# Patient Record
Sex: Female | Born: 1960 | Race: Asian | Hispanic: No | Marital: Married | Smoking: Never smoker
Health system: Southern US, Community
[De-identification: ages and names within clinical notes are randomized; demographics above are authoritative.]

## PROBLEM LIST (undated history)

## (undated) DIAGNOSIS — E7800 Pure hypercholesterolemia, unspecified: Secondary | ICD-10-CM

## (undated) DIAGNOSIS — E039 Hypothyroidism, unspecified: Secondary | ICD-10-CM

## (undated) DIAGNOSIS — M1990 Unspecified osteoarthritis, unspecified site: Secondary | ICD-10-CM

## (undated) HISTORY — PX: THYROID SURGERY: SHX805

## (undated) HISTORY — PX: ABDOMINAL HYSTERECTOMY: SHX81

## (undated) HISTORY — PX: ROTATOR CUFF REPAIR: SHX139

---

## 2003-06-02 ENCOUNTER — Other Ambulatory Visit: Admission: RE | Admit: 2003-06-02 | Discharge: 2003-06-02 | Payer: Self-pay | Admitting: Obstetrics and Gynecology

## 2003-06-18 ENCOUNTER — Ambulatory Visit (HOSPITAL_COMMUNITY): Admission: RE | Admit: 2003-06-18 | Discharge: 2003-06-18 | Payer: Self-pay | Admitting: Obstetrics and Gynecology

## 2003-08-06 ENCOUNTER — Encounter (INDEPENDENT_AMBULATORY_CARE_PROVIDER_SITE_OTHER): Payer: Self-pay | Admitting: *Deleted

## 2003-08-06 ENCOUNTER — Ambulatory Visit (HOSPITAL_COMMUNITY): Admission: RE | Admit: 2003-08-06 | Discharge: 2003-08-06 | Payer: Self-pay | Admitting: Surgery

## 2003-12-19 IMAGING — US US BIOPSY
1 series · 8 of 8 positions shown · non-contrast
Comparison: none

CLINICAL DATA: Patient with a history of thyroid ultrasound at [HOSPITAL] on 06/18/03 which revealed 1.9 x 1.2 x 1.2 cm solitary mass in the inferior aspect of the right thyroid lobe.  Request is made for fine needle aspiration of this solitary right thyroid nodule. 
ULTRASOUND GUIDED FINE NEEDLE ASPIRATION OF SOLITARY RIGHT THYROID LOBE NODULE   - 08/06/03
FINDINGS
An ultrasound guided thyroid biopsy was thoroughly discussed with the patient and questions were answered.  Risks and benefits of the procedure were also delineated.  Most specifically discussion included bleeding, bruising, infection, risks of injury to adjacent blood vessels and nerves. The patient understands and wishes to proceed.  Verbal and written consent was obtained.  
After the patient was prepped and draped in the normal sterile fashion, 1 percent lidocaine was used for local anesthesia.  Under direct ultrasound guidance, four passes were then made using 25 gauge hypodermic needles into the solitary right thyroid lobe nodule. Ultrasound imaging confirmed appropriate needle placement in the nodule. Specimens were given to cytology for further analysis. The patient tolerated the procedure well and there were no immediate complications.  No hematoma was identified post procedure.  The procedure was performed under the direct supervision of Dr. Portaru Ratta.  
IMPRESSION
Successful ultrasound guided fine needle aspiration of a solitary right thyroid lobe nodule as discussed above.

[Series 1: unknown · 0.07mm/px · 8 of 8 slices shown]
[im 1/8]
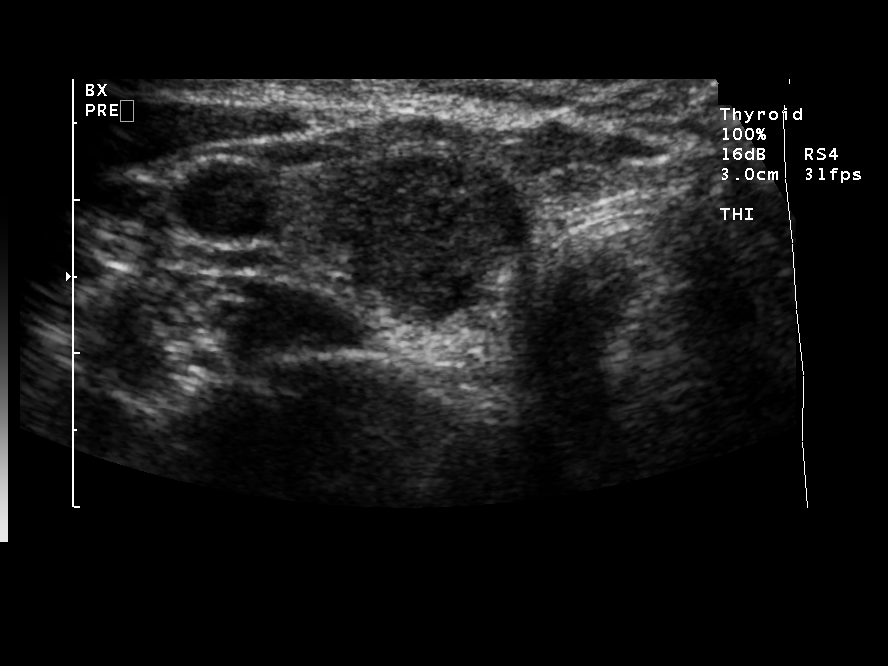
[im 2/8]
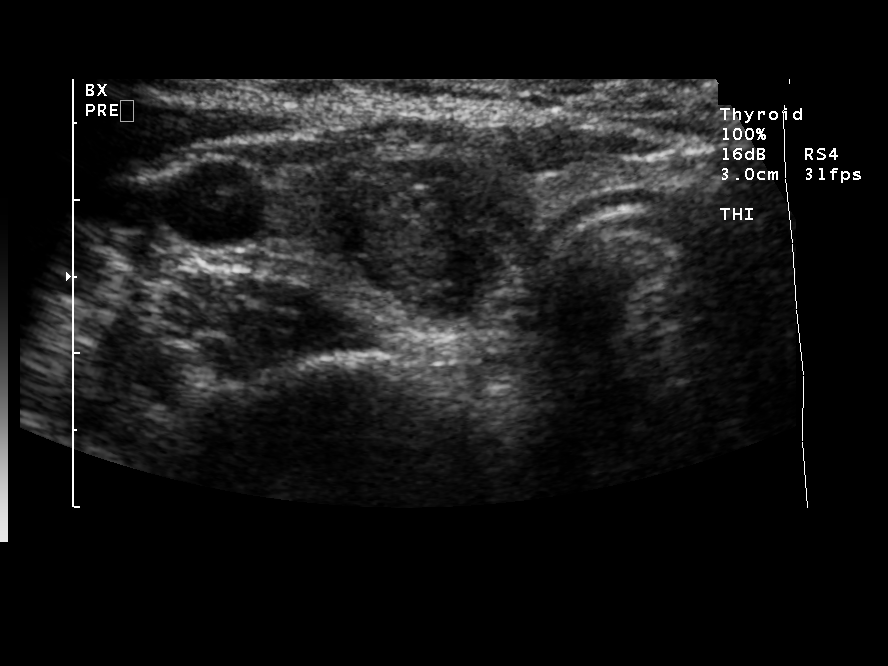
[im 3/8]
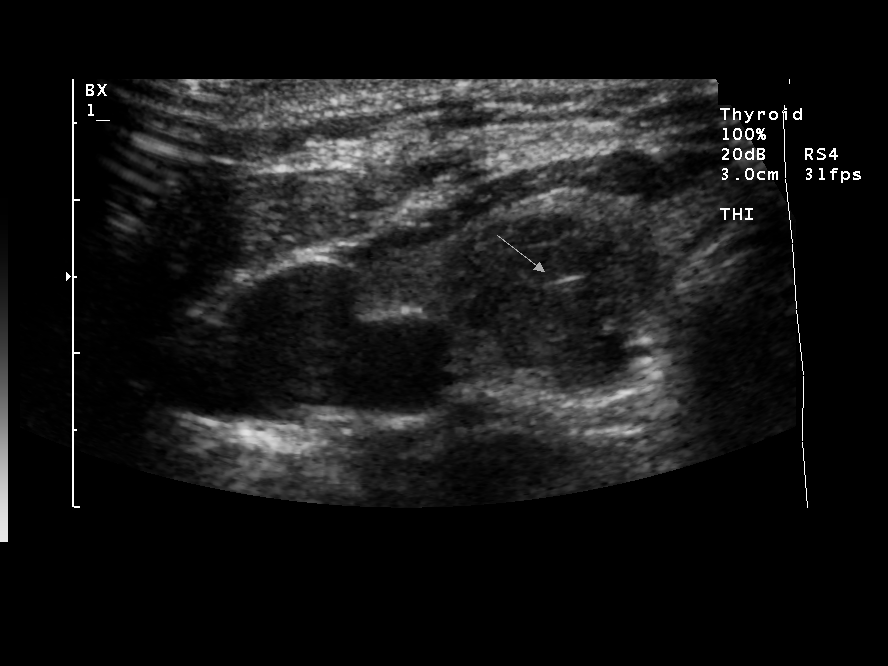
[im 4/8]
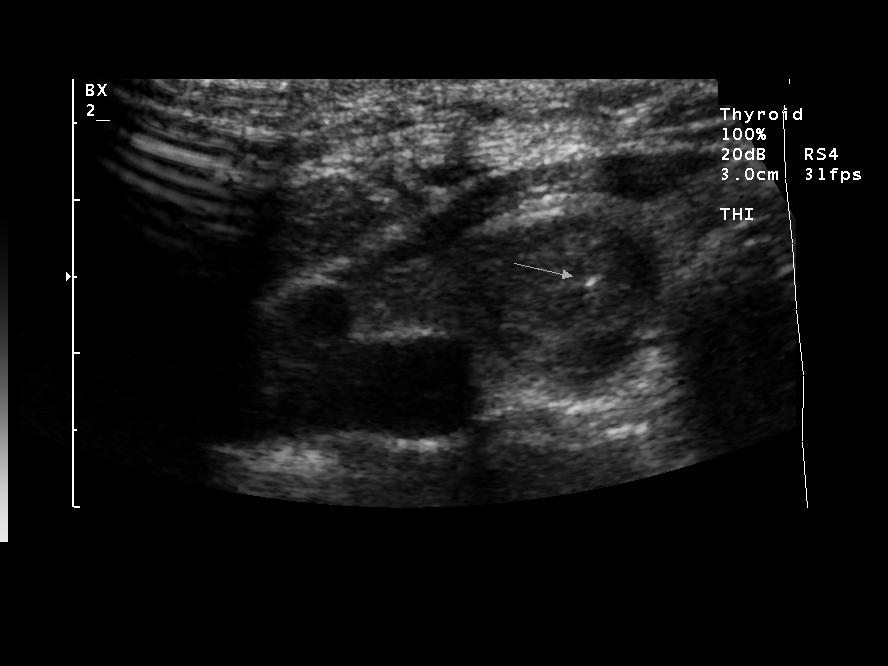
[im 5/8]
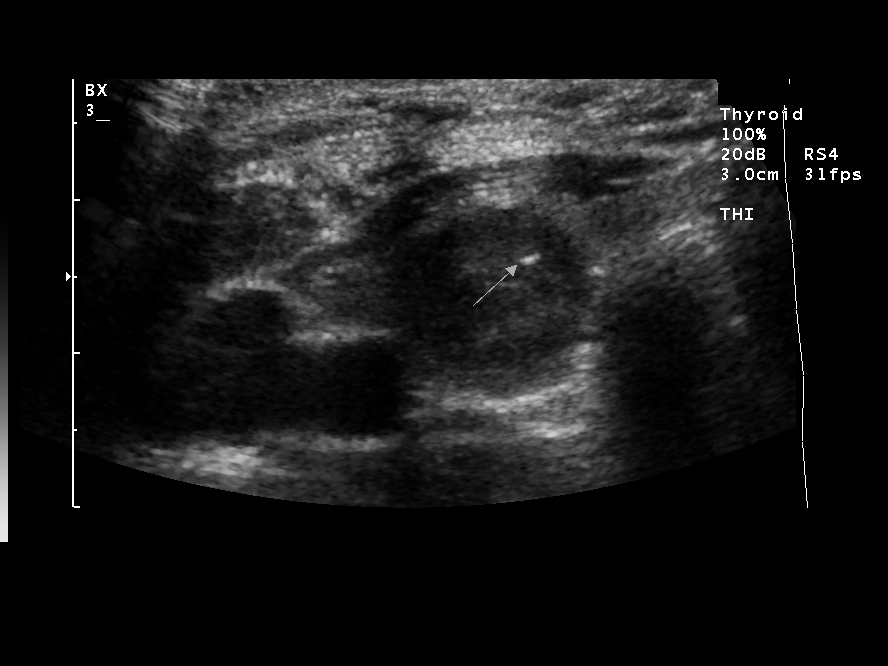
[im 6/8]
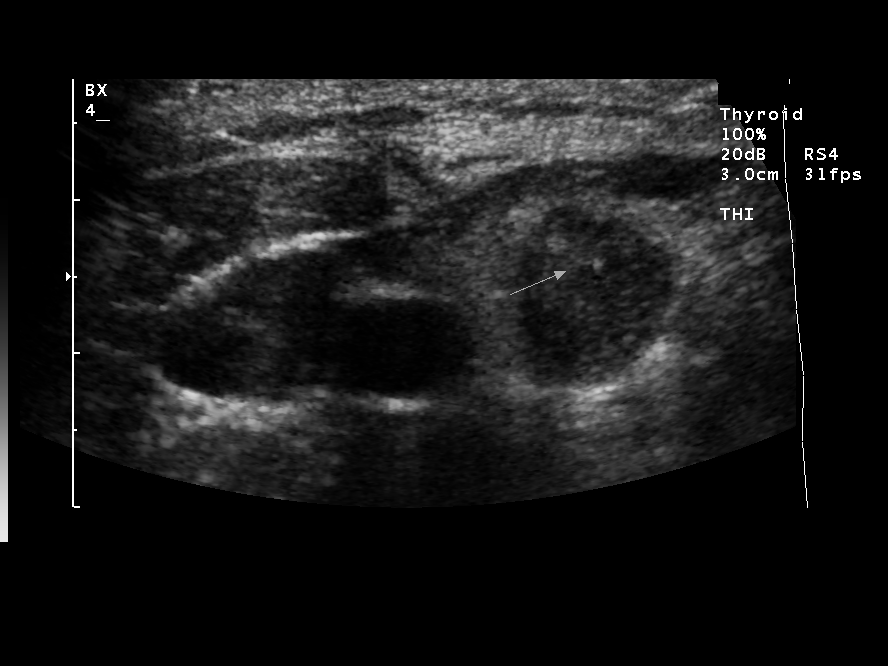
[im 7/8]
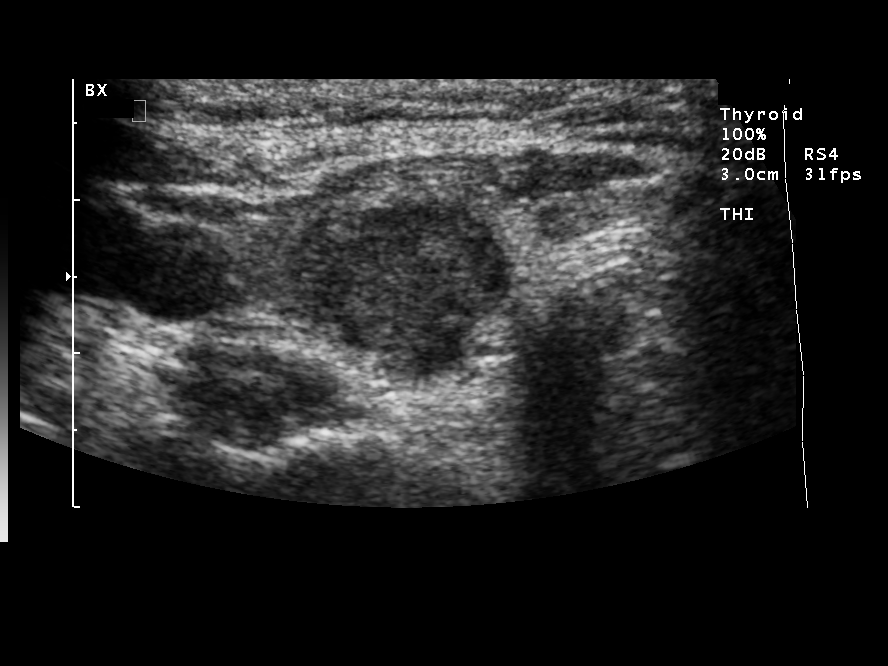
[im 8/8]
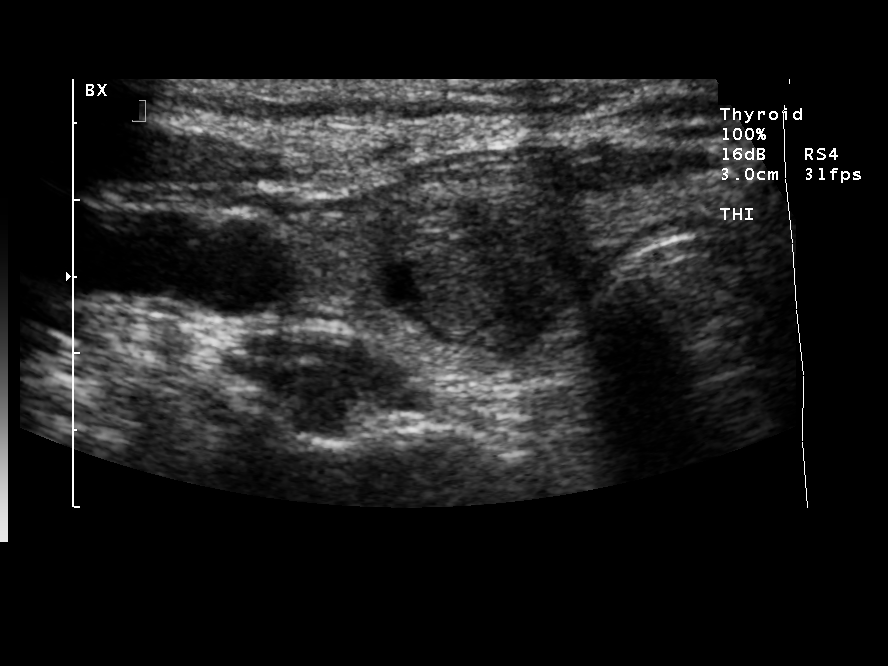

[8 of 8 positions shown; findings below may reference images not displayed]

## 2004-04-21 ENCOUNTER — Encounter: Admission: RE | Admit: 2004-04-21 | Discharge: 2004-04-21 | Payer: Self-pay | Admitting: Surgery

## 2004-07-14 ENCOUNTER — Ambulatory Visit: Payer: Self-pay | Admitting: Internal Medicine

## 2005-08-22 ENCOUNTER — Emergency Department (HOSPITAL_COMMUNITY): Admission: EM | Admit: 2005-08-22 | Discharge: 2005-08-22 | Payer: Self-pay | Admitting: Emergency Medicine

## 2005-09-17 ENCOUNTER — Other Ambulatory Visit: Admission: RE | Admit: 2005-09-17 | Discharge: 2005-09-17 | Payer: Self-pay | Admitting: Obstetrics and Gynecology

## 2005-10-29 ENCOUNTER — Encounter: Admission: RE | Admit: 2005-10-29 | Discharge: 2005-10-29 | Payer: Self-pay | Admitting: Surgery

## 2005-11-13 ENCOUNTER — Encounter: Admission: RE | Admit: 2005-11-13 | Discharge: 2005-11-13 | Payer: Self-pay | Admitting: Obstetrics and Gynecology

## 2006-03-26 ENCOUNTER — Encounter: Admission: RE | Admit: 2006-03-26 | Discharge: 2006-03-26 | Payer: Self-pay | Admitting: Surgery

## 2009-07-26 ENCOUNTER — Encounter: Admission: RE | Admit: 2009-07-26 | Discharge: 2009-07-26 | Payer: Self-pay | Admitting: Surgery

## 2009-07-26 ENCOUNTER — Other Ambulatory Visit: Admission: RE | Admit: 2009-07-26 | Discharge: 2009-07-26 | Payer: Self-pay | Admitting: Interventional Radiology

## 2009-09-13 ENCOUNTER — Encounter (INDEPENDENT_AMBULATORY_CARE_PROVIDER_SITE_OTHER): Payer: Self-pay | Admitting: Surgery

## 2009-09-13 ENCOUNTER — Ambulatory Visit (HOSPITAL_COMMUNITY): Admission: RE | Admit: 2009-09-13 | Discharge: 2009-09-14 | Payer: Self-pay | Admitting: Surgery

## 2009-12-08 IMAGING — US US BIOPSY
1 series · 8 of 8 positions shown · non-contrast
Comparison: none

Clinical Data/Indication: Dominant right thyroid nodule

ULTRASOUND-GUIDED BIOPSYOF A DOMINANT RIGHT THYROID NODULE.  FINE
NEEDLE ASPIRATION.
Procedure: The procedure, risks, benefits, and alternatives were
explained to the patient. Questions regarding the procedure were
encouraged and answered. The patient understands and consents to
the procedure.
The right neck was prepped with betadine in a sterile fashion, and
a sterile drape was applied covering the operative field. A sterile
gown and sterile gloves were used for the procedure.
Under sonographic guidance, three 25 gauge fine needle aspirates of
the dominant right thyroid nodule were obtained. Final imaging was
performed.

[Series 1: us biopsy · 0.06mm/px · 8 acquisitions, 8 frames shown]
[im 1/8]
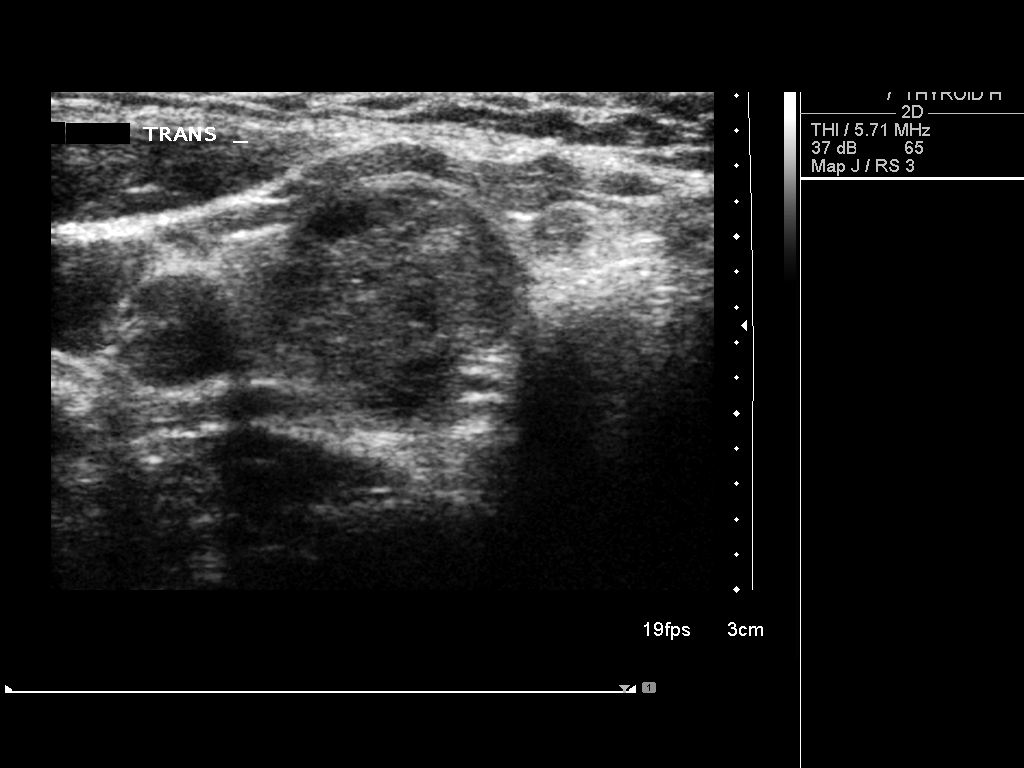
[im 2/8]
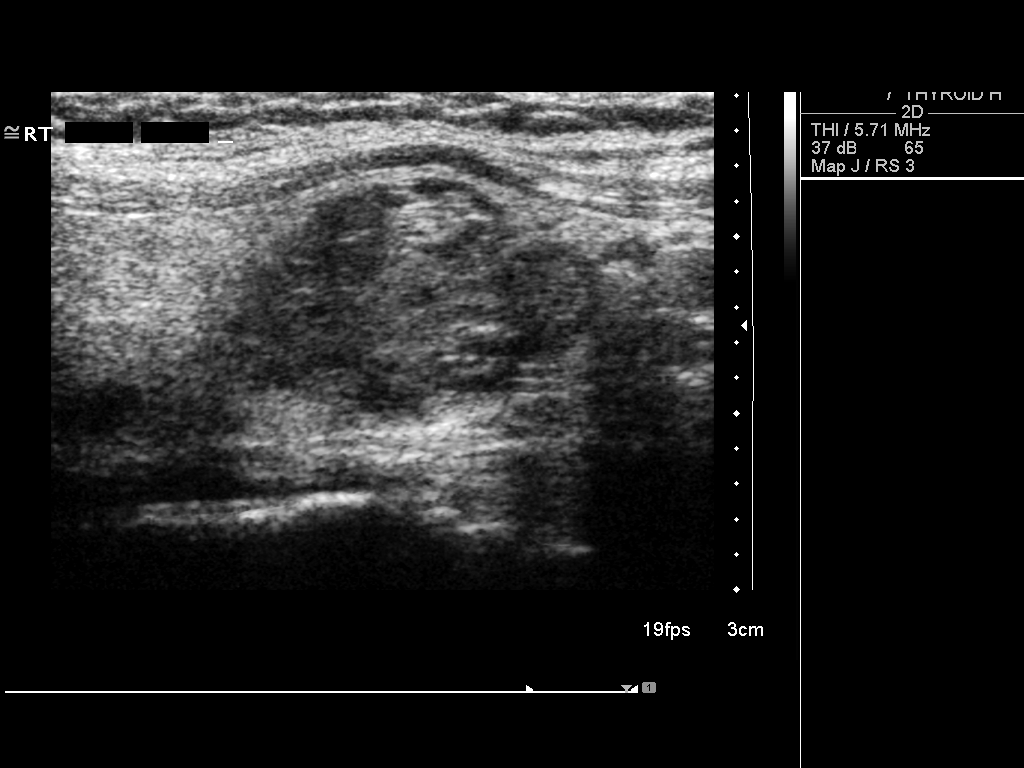
[im 3/8]
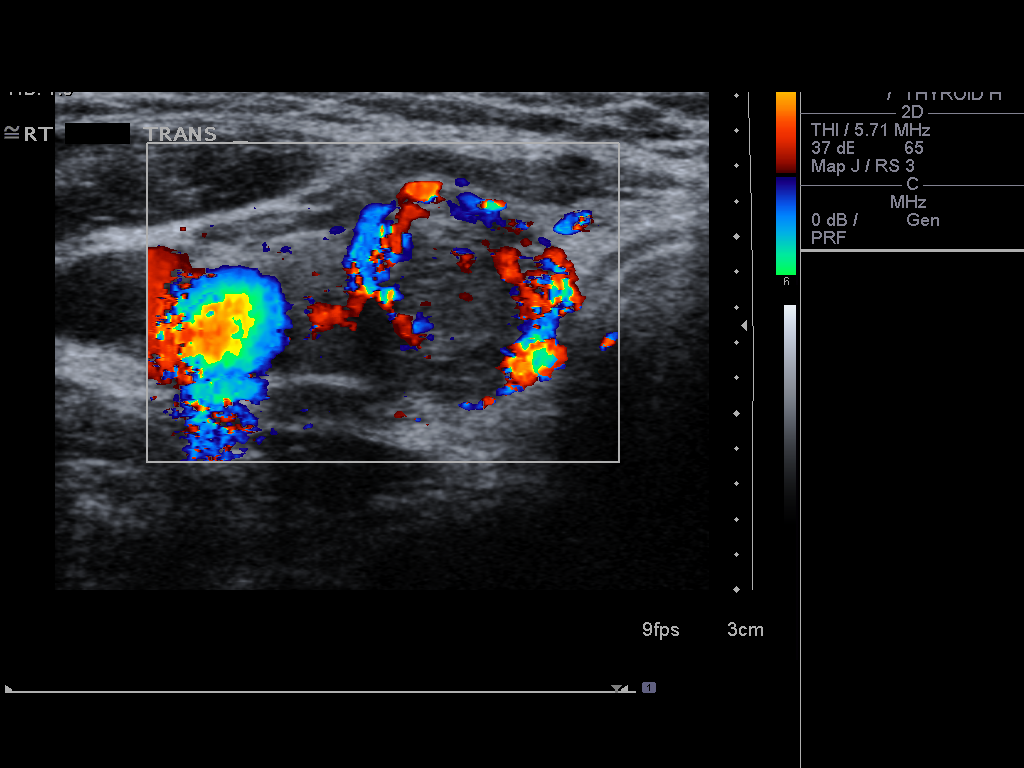
[im 4/8]
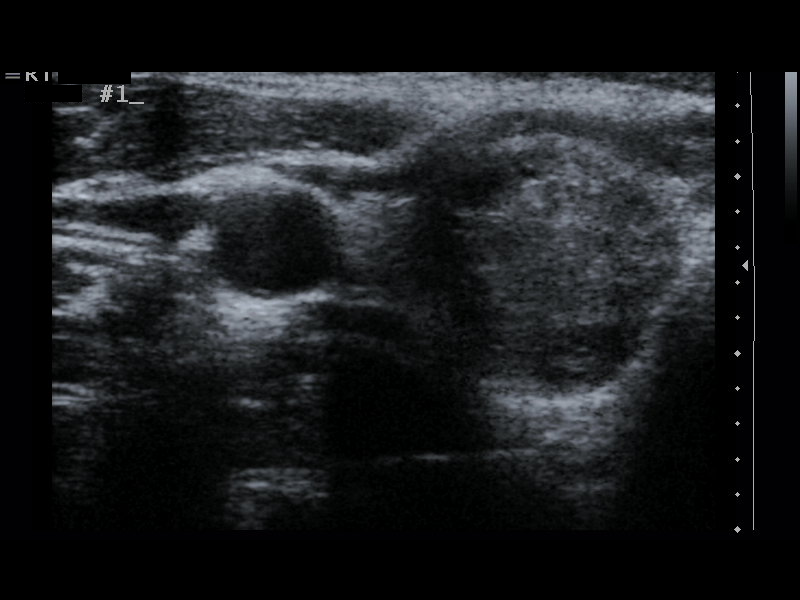
[im 5/8]
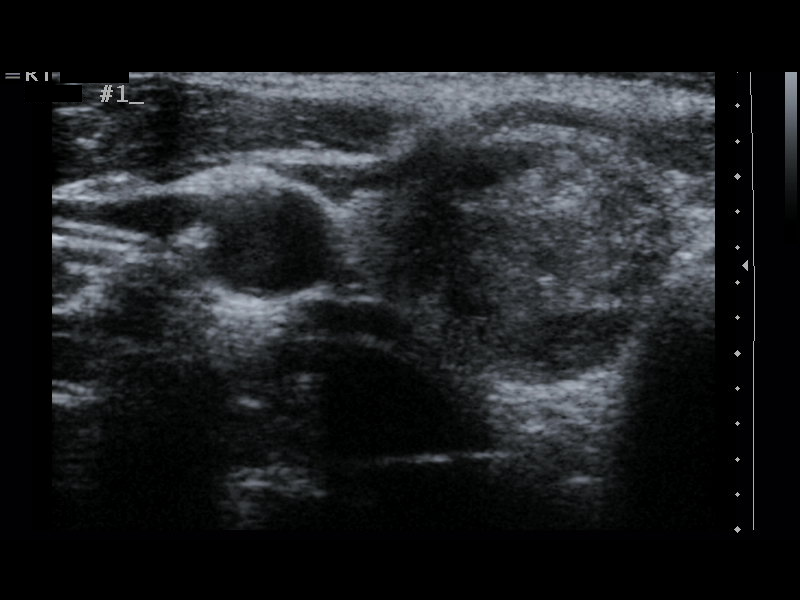
[im 6/8]
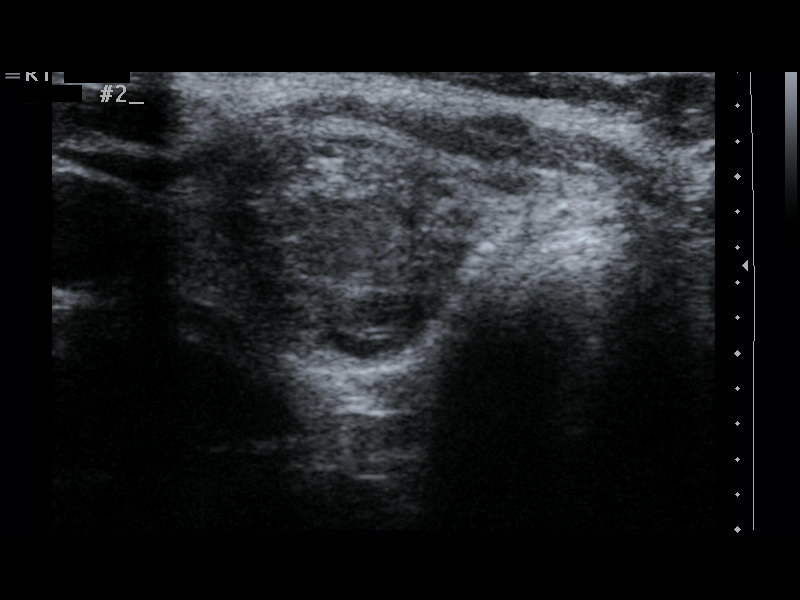
[im 7/8]
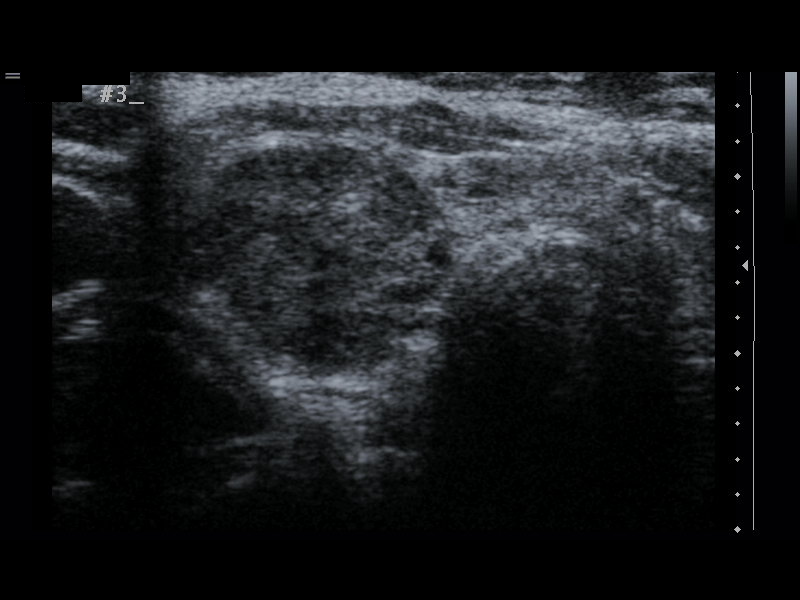
[im 8/8]
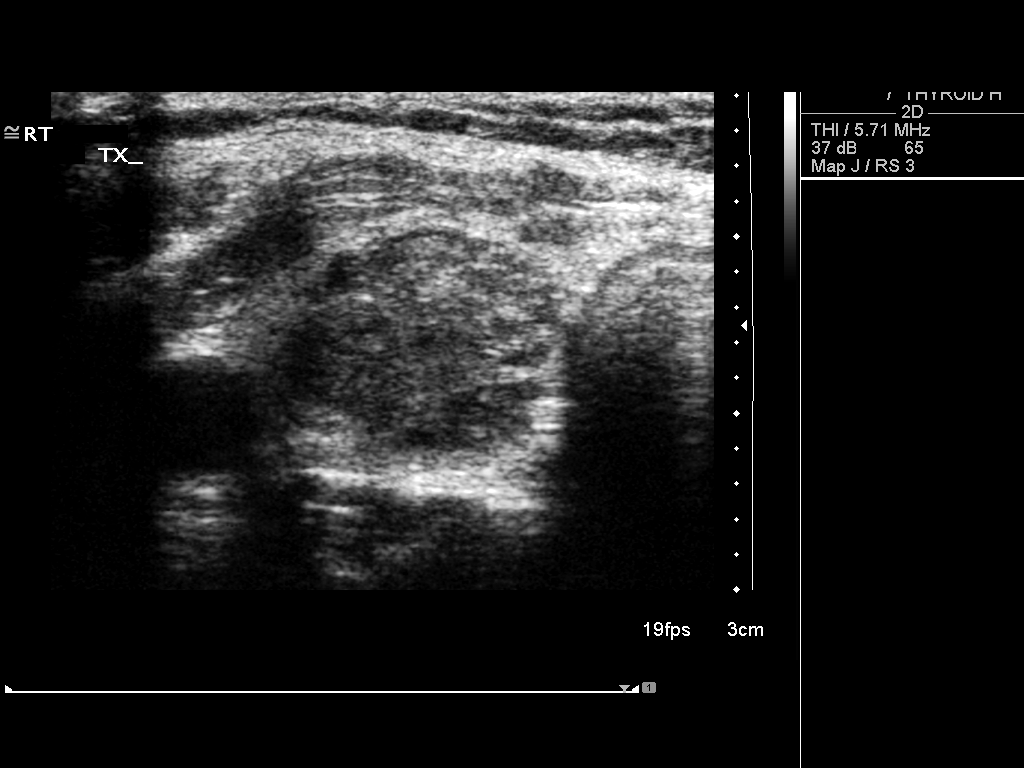

[8 of 8 positions shown; findings below may reference images not displayed]

FINDINGS: The images document guide needle placement within the
dominant right thyroid nodule. Post biopsy images demonstrate no
hemorrhage.
IMPRESSION: Successful ultrasound-guided fine needle aspiration of a dominant
right thyroid nodule.

## 2010-11-05 LAB — BASIC METABOLIC PANEL
BUN: 19 mg/dL (ref 6–23)
CO2: 28 mEq/L (ref 19–32)
Calcium: 9.3 mg/dL (ref 8.4–10.5)
Chloride: 107 mEq/L (ref 96–112)
Creatinine, Ser: 0.63 mg/dL (ref 0.4–1.2)
GFR calc Af Amer: >60 mL/min (ref >60)
GFR calc non Af Amer: >60 mL/min (ref >60)
Glucose, Bld: 106 mg/dL — ABNORMAL HIGH (ref 70–99)
Potassium: 4.1 mEq/L (ref 3.5–5.1)
Sodium: 140 mEq/L (ref 135–145)

## 2010-11-05 LAB — URINALYSIS, ROUTINE W REFLEX MICROSCOPIC
Bilirubin Urine: NEGATIVE
Glucose, UA: NEGATIVE mg/dL
Hgb urine dipstick: NEGATIVE
Ketones, ur: NEGATIVE mg/dL
Nitrite: NEGATIVE
Protein, ur: NEGATIVE mg/dL
Specific Gravity, Urine: 1.028 (ref 1.005–1.030)
Urobilinogen, UA: 1 mg/dL (ref 0.0–1.0)
pH: 6 (ref 5.0–8.0)

## 2010-11-05 LAB — CBC
HCT: 42.2 % (ref 36.0–46.0)
Hemoglobin: 14.7 g/dL (ref 12.0–15.0)
MCHC: 34.9 g/dL (ref 30.0–36.0)
MCV: 89.9 fL (ref 78.0–100.0)
Platelets: 230 10*3/uL (ref 150–400)
RBC: 4.69 MIL/uL (ref 3.87–5.11)
RDW: 12.1 % (ref 11.5–15.5)
WBC: 6.8 10*3/uL (ref 4.0–10.5)

## 2010-11-05 LAB — DIFFERENTIAL
Basophils Absolute: 0.1 10*3/uL (ref 0.0–0.1)
Basophils Relative: 1 % (ref 0–1)
Eosinophils Absolute: 0.1 10*3/uL (ref 0.0–0.7)
Eosinophils Relative: 2 % (ref 0–5)
Lymphocytes Relative: 30 % (ref 12–46)
Lymphs Abs: 2.1 10*3/uL (ref 0.7–4.0)
Monocytes Absolute: 0.4 10*3/uL (ref 0.1–1.0)
Monocytes Relative: 5 % (ref 3–12)
Neutro Abs: 4.3 10*3/uL (ref 1.7–7.7)
Neutrophils Relative %: 62 % (ref 43–77)

## 2010-11-05 LAB — PROTIME-INR
INR: 0.89 (ref 0.00–1.49)
Prothrombin Time: 12 seconds (ref 11.6–15.2)

## 2010-12-16 ENCOUNTER — Encounter (INDEPENDENT_AMBULATORY_CARE_PROVIDER_SITE_OTHER): Payer: Self-pay | Admitting: Surgery

## 2011-02-12 ENCOUNTER — Other Ambulatory Visit (INDEPENDENT_AMBULATORY_CARE_PROVIDER_SITE_OTHER): Payer: Self-pay | Admitting: Surgery

## 2011-02-12 LAB — TSH: TSH: 0.394 u[IU]/mL (ref 0.350–4.500)

## 2011-12-03 DIAGNOSIS — E559 Vitamin D deficiency, unspecified: Secondary | ICD-10-CM | POA: Insufficient documentation

## 2011-12-03 DIAGNOSIS — K648 Other hemorrhoids: Secondary | ICD-10-CM | POA: Insufficient documentation

## 2011-12-03 DIAGNOSIS — L209 Atopic dermatitis, unspecified: Secondary | ICD-10-CM | POA: Insufficient documentation

## 2012-01-08 ENCOUNTER — Telehealth (INDEPENDENT_AMBULATORY_CARE_PROVIDER_SITE_OTHER): Payer: Self-pay

## 2012-01-08 NOTE — Telephone Encounter (Signed)
I spoke with pt via phone. We received a request for synthroid from [facility].  Pt advised per Dr Gerrit Friends she needs a follow up visit. Pt states Dr Everlene Other is following her for her thyroid. I advised her per Dr Gerrit Friends that we will refill med but she must have f/u labs done. Pt states she has had them at Dr Bonney Leitz office. Pt states she will contact her office for future refills.

## 2012-01-30 DIAGNOSIS — M5126 Other intervertebral disc displacement, lumbar region: Secondary | ICD-10-CM | POA: Insufficient documentation

## 2012-02-25 DIAGNOSIS — G4700 Insomnia, unspecified: Secondary | ICD-10-CM | POA: Insufficient documentation

## 2013-01-19 DIAGNOSIS — Z9849 Cataract extraction status, unspecified eye: Secondary | ICD-10-CM | POA: Insufficient documentation

## 2013-03-30 DIAGNOSIS — E039 Hypothyroidism, unspecified: Secondary | ICD-10-CM | POA: Insufficient documentation

## 2013-04-17 DIAGNOSIS — M25519 Pain in unspecified shoulder: Secondary | ICD-10-CM | POA: Insufficient documentation

## 2013-06-23 DIAGNOSIS — J309 Allergic rhinitis, unspecified: Secondary | ICD-10-CM | POA: Insufficient documentation

## 2013-10-12 DIAGNOSIS — I10 Essential (primary) hypertension: Secondary | ICD-10-CM | POA: Insufficient documentation

## 2014-01-12 ENCOUNTER — Ambulatory Visit (INDEPENDENT_AMBULATORY_CARE_PROVIDER_SITE_OTHER): Payer: Managed Care, Other (non HMO) | Admitting: Podiatry

## 2014-01-12 ENCOUNTER — Encounter: Payer: Self-pay | Admitting: Podiatry

## 2014-01-12 VITALS — BP 123/85 | HR 81 | Ht 62.0 in | Wt 140.0 lb

## 2014-01-12 DIAGNOSIS — M2580 Other specified joint disorders, unspecified joint: Secondary | ICD-10-CM

## 2014-01-12 DIAGNOSIS — M21969 Unspecified acquired deformity of unspecified lower leg: Secondary | ICD-10-CM

## 2014-01-12 DIAGNOSIS — M79609 Pain in unspecified limb: Secondary | ICD-10-CM

## 2014-01-12 DIAGNOSIS — M79606 Pain in leg, unspecified: Secondary | ICD-10-CM | POA: Insufficient documentation

## 2014-01-12 DIAGNOSIS — M948X9 Other specified disorders of cartilage, unspecified sites: Secondary | ICD-10-CM

## 2014-01-12 MED ORDER — NABUMETONE 500 MG PO TABS: 500.0000 mg | ORAL_TABLET | Freq: Two times a day (BID) | ORAL | Status: DC

## 2014-01-12 NOTE — Progress Notes (Signed)
Subjective: 54 year old female presents complaining of pain under the first MPJ right foot for last 2 weeks.  Work standing on feet x 10-12 hours per day.   Objective: Dermatologic: No open skin lesions. Neurovascular status are within norma. Pain at first MPJ plantar. Palpable sharp protruding bone under the first MPJ. Localized edema and inflammation under 1st MPJ right. Mild HAV with bunion bilateral.  X-ray: Noted of separation of Tibial sesamoid right foot just under the cresta of the Metatarsal head. No separation on left foot.  No other significant osseous changes.   Assessment: Sesamoiditis right foot. Bilateral bunion.  Plan: Aperture pad x 2 to put in Tennis shoes. Casted for Orthotics.

## 2014-01-12 NOTE — Patient Instructions (Addendum)
Seen for pain under right foot. X-ray show possible fractured Sesamoid bone right foot.  Both feet casted for Orthotics.  May benefit from aperture pad, dispensed.

## 2014-02-05 ENCOUNTER — Other Ambulatory Visit: Payer: Self-pay | Admitting: Podiatry

## 2014-03-01 ENCOUNTER — Ambulatory Visit (INDEPENDENT_AMBULATORY_CARE_PROVIDER_SITE_OTHER): Payer: Managed Care, Other (non HMO) | Admitting: Podiatry

## 2014-03-01 ENCOUNTER — Encounter: Payer: Self-pay | Admitting: Podiatry

## 2014-03-01 DIAGNOSIS — M948X9 Other specified disorders of cartilage, unspecified sites: Secondary | ICD-10-CM

## 2014-03-01 DIAGNOSIS — M2580 Other specified joint disorders, unspecified joint: Secondary | ICD-10-CM

## 2014-03-01 NOTE — Progress Notes (Signed)
Now pain is gone but still does not feel normal when walking.  Tender under the first MPJ right. 1/8" felt pad added to right orthotic first MPJ plantar surface. May require sesamoidal planning in future if pain continues.

## 2014-03-01 NOTE — Patient Instructions (Signed)
Doing well with orthotics. Return as needed. 

## 2014-04-16 DIAGNOSIS — Z0000 Encounter for general adult medical examination without abnormal findings: Secondary | ICD-10-CM | POA: Insufficient documentation

## 2014-04-16 DIAGNOSIS — Z9889 Other specified postprocedural states: Secondary | ICD-10-CM | POA: Insufficient documentation

## 2014-04-16 DIAGNOSIS — Z8669 Personal history of other diseases of the nervous system and sense organs: Secondary | ICD-10-CM | POA: Insufficient documentation

## 2014-05-20 ENCOUNTER — Other Ambulatory Visit: Payer: Self-pay | Admitting: Obstetrics and Gynecology

## 2014-05-21 LAB — CYTOLOGY - PAP

## 2014-11-11 DIAGNOSIS — J302 Other seasonal allergic rhinitis: Secondary | ICD-10-CM | POA: Insufficient documentation

## 2015-04-07 ENCOUNTER — Ambulatory Visit: Payer: Managed Care, Other (non HMO) | Attending: Orthopaedic Surgery | Admitting: Rehabilitation

## 2015-04-07 DIAGNOSIS — M6281 Muscle weakness (generalized): Secondary | ICD-10-CM | POA: Diagnosis present

## 2015-04-07 DIAGNOSIS — M25512 Pain in left shoulder: Secondary | ICD-10-CM | POA: Insufficient documentation

## 2015-04-07 NOTE — Therapy (Signed)
[facility] [place] [address] [place], [place], [address] Phone: [phone]   Fax:  [phone]  Physical Therapy Evaluation  Patient Details  Name: Veronica Watkins MRN: [id number] Date of Birth: 1961-01-17 Referring Provider:  Valeria Batman, MD  Encounter Date: 04/07/2015      PT End of Session - 04/07/15 1150    Visit Number 1   Date for PT Re-Evaluation 05/08/15   PT Start Time 1015   PT Stop Time 1115   PT Time Calculation (min) 60 min      No past medical history on file.  No past surgical history on file.  There were no vitals filed for this visit.  Visit Diagnosis:  Pain in joint, shoulder region, left - Plan: PT plan of care cert/re-cert  Muscle weakness of left upper extremity - Plan: PT plan of care cert/re-cert      Subjective Assessment - 04/07/15 1020    Subjective Pt. referred to PT following a L shoulder scope and SAD 03/10/15.    Reports 1 yr of shoulder pain that failed conservative mgmt (PT, cortisone, etc).   She will f/u with MD tomorrow.    Currently OOW til 04/20/15.    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      How long can you sit comfortably? na   How long can you stand comfortably? na   How long can you walk comfortably? na   Patient Stated Goals no pain   Currently in Pain? Yes   Pain Score 1    Pain Location Shoulder   Pain Orientation Left   Pain Descriptors / Indicators Aching;Sore   Pain Type Chronic pain   Pain Onset More than a month ago   Aggravating Factors  time of day--worst in AM and some difficulty sleeping, lifting is painful   Pain Relieving Factors meds, rest            [facility] PT Assessment - 04/07/15 0001    Assessment   Medical Diagnosis L shoulder SAD   Onset Date/Surgical Date 03/10/15   Hand Dominance Right   Next MD Visit 04/08/15   Prior Therapy yes   Precautions   Precautions None    Restrictions   Weight Bearing Restrictions No   Balance Screen   Has the patient fallen in the past 6 months No   Has the patient had a decrease in activity level because of a fear of falling?  No   Is the patient reluctant to leave their home because of a fear of falling?  No   Home Tourist information centre manager residence   Living Arrangements Spouse/significant other   Prior Function   Level of Independence Independent   ROM / Strength   AROM / PROM / Strength AROM;Strength   AROM   AROM Assessment Site Shoulder   Right/Left Shoulder Right   Right Shoulder Extension 40 Degrees   Right Shoulder Flexion 145 Degrees   Right Shoulder ABduction 90 Degrees   Right Shoulder Internal Rotation 50 Degrees   Right Shoulder External Rotation 20 Degrees   Strength   Strength Assessment Site Shoulder   Right/Left Shoulder Right   Right Shoulder Flexion 4-/5   Right Shoulder Extension 4-/5   Right Shoulder ABduction 3/5   Right Shoulder Internal Rotation 5/5   Right Shoulder External Rotation 4-/5   Right Shoulder Horizontal ABduction  4-/5                   [facility] Adult PT Treatment/Exercise - 04/07/15 0001    Exercises   Exercises Shoulder   Shoulder Exercises: Supine   Other Supine Exercises cane ER stretch x 1' x 3   Shoulder Exercises: Prone   Other Prone Exercises HABD x 50 0#   Shoulder Exercises: Sidelying   External Rotation Weight (lbs) 0   External Rotation Limitations x50   Shoulder Exercises: Standing   Theraband Level (Shoulder Row) Level 3 (Green)   Row Limitations 50 x   Shoulder Exercises: ROM/Strengthening   Other ROM/Strengthening Exercises seated counter ER stretch x 1' x 3   Modalities   Modalities Electrical Stimulation;Moist Heat   Moist Heat Therapy   Number Minutes Moist Heat 15 Minutes   Moist Heat Location Shoulder   Electrical Stimulation   Electrical Stimulation Location L shoulder   Electrical Stimulation Action IFC to  tolerance   Electrical Stimulation Goals Pain                PT Education - 04/07/15 1032    Education provided Yes   Education Details cane ER stretch;   sidelying ER, red TB ER, rowing 1 UE, both UE   Person(s) Educated Patient   Methods Explanation;Demonstration   Comprehension Verbal cues required;Tactile cues required;Need further instruction          PT Short Term Goals - 04/07/15 1055    PT SHORT TERM GOAL #1   Title independent HEP   Time 4   Period Weeks   Status New   PT SHORT TERM GOAL #2   Title L shoulder ROM as follows:  170 deg flexion; 145 deg ABD, 60-70 deg ER,    IR to T9   Time 4   Period Weeks   Status New           PT Long Term Goals - 04/07/15 1056    PT LONG TERM GOAL #1   Title Pt. will have 0/10 pain   Time 8   Period Weeks   Status New   PT LONG TERM GOAL #2   Title Patient will be able to lift 5 lbs overhead without pain for ADL   Time 8   Period Weeks   Status New   PT LONG TERM GOAL #3   Title Patient will be able to lift and transfer weight from R side of body to L up to 20 lbs without pain for return to work at [employer]   Time 8   Period Weeks   Status New               Plan - 04/07/15 1146    Clinical Impression Statement Pt. with decreased ROM for elevation and ER/IR.    Strength tests are all painful with min resistance.   Joint mobilizations are restricted both posteriorly and inferiorly in elevated positions   Pt will benefit from skilled therapeutic intervention in order to improve on the following deficits Decreased range of motion;Decreased strength   Rehab Potential Good   PT Frequency 2x / week   PT Duration 8 weeks   PT Treatment/Interventions Electrical Stimulation;Cryotherapy;Moist Heat;Ultrasound;Therapeutic activities;Therapeutic exercise;Patient/family education;Manual techniques   PT Next Visit Plan jt mobs, ER stretching, retractor/rotator strengthening         Problem  List Patient Active Problem List   Diagnosis Date Noted  . Sesamoiditis 01/12/2014  .  Pain in lower limb 01/12/2014  . 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 [place], [place], [address] Phone: [phone]   Fax:  [phone]

## 2015-04-13 ENCOUNTER — Encounter: Payer: Self-pay | Admitting: Physical Therapy

## 2015-04-13 ENCOUNTER — Ambulatory Visit: Payer: Managed Care, Other (non HMO) | Admitting: Physical Therapy

## 2015-04-13 DIAGNOSIS — M6281 Muscle weakness (generalized): Secondary | ICD-10-CM

## 2015-04-13 DIAGNOSIS — M25512 Pain in left shoulder: Secondary | ICD-10-CM | POA: Diagnosis not present

## 2015-04-13 NOTE — Therapy (Signed)
[facility] [place] [facility] [address] [place], [place], [address] Phone: [phone]   Fax:  [phone]  Physical Therapy Treatment  Patient Details  Name: Veronica Watkins MRN: [id number] Date of Birth: 1960/11/15 Referring Provider:  Valeria Batman, MD  Encounter Date: 04/13/2015      PT End of Session - 04/13/15 1138    Visit Number 2   Date for PT Re-Evaluation 05/08/15   PT Start Time 1015   PT Stop Time 1120   PT Time Calculation (min) 65 min   Activity Tolerance Patient tolerated treatment well   Behavior During Therapy [facility] for tasks assessed/performed      History reviewed. No pertinent past medical history.  History reviewed. No pertinent past surgical history.  There were no vitals filed for this visit.  Visit Diagnosis:  Pain in joint, shoulder region, left  Muscle weakness of left upper extremity      Subjective Assessment - 04/13/15 1134    Subjective Reports that she has been trying exercises at home, reports tight in the anterior and lateral shoulder, difficulty putting on shirts   Currently in Pain? Yes   Pain Score 2    Pain Location Shoulder   Pain Orientation Left;Anterior;Lateral   Pain Descriptors / Indicators Aching                         [facility] Adult PT Treatment/Exercise - 04/13/15 0001    Shoulder Exercises: ROM/Strengthening   UBE (Upper Arm Bike) Level 3 4 minutes   Shoulder Exercises: Power Soil scientist Exercises seated row 15#, lats15#, red tband scap stabilization 2 ways, red tband ER   Other Power [employer] Exercises wall slides, circles and abduction, corner stretch   Moist Heat Therapy   Number Minutes Moist Heat 15 Minutes   Moist Heat Location Shoulder   Electrical Stimulation   Electrical Stimulation Location L shoulder   Electrical Stimulation Action IFC   Electrical Stimulation Parameters tolernace   Electrical Stimulation Goals Pain   Manual  Therapy   Manual Therapy Passive ROM   Passive ROM PROM of the left shoulder all motions to end range                  PT Short Term Goals - 04/07/15 1055    PT SHORT TERM GOAL #1   Title independent HEP   Time 4   Period Weeks   Status New   PT SHORT TERM GOAL #2   Title L shoulder ROM as follows:  170 deg flexion; 145 deg ABD, 60-70 deg ER,    IR to T9   Time 4   Period Weeks   Status New           PT Long Term Goals - 04/07/15 1056    PT LONG TERM GOAL #1   Title Pt. will have 0/10 pain   Time 8   Period Weeks   Status New   PT LONG TERM GOAL #2   Title Patient will be able to lift 5 lbs overhead without pain for ADL   Time 8   Period Weeks   Status New   PT LONG TERM GOAL #3   Title Patient will be able to lift and transfer weight from R side of body to L up to 20 lbs without pain for return to work at [employer]   Time 8  Period Weeks   Status New               Plan - 04/13/15 1138    Clinical Impression Statement Very tight with ER/IR, difficulty dressing, some pain and spasms in the left upper trap and in the left upper arm   PT Next Visit Plan jt mobs, ER stretching, retractor/rotator strengthening   Consulted and Agree with Plan of Care Patient        Problem List Patient Active Problem List   Diagnosis Date Noted  . Sesamoiditis 01/12/2014  . Pain in lower limb 01/12/2014  . 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 [place], [place], [address] Phone: [phone]   Fax:  [phone]

## 2015-04-20 ENCOUNTER — Ambulatory Visit: Payer: Managed Care, Other (non HMO) | Admitting: Physical Therapy

## 2015-04-20 ENCOUNTER — Encounter: Payer: Self-pay | Admitting: Physical Therapy

## 2015-04-20 DIAGNOSIS — M6281 Muscle weakness (generalized): Secondary | ICD-10-CM

## 2015-04-20 DIAGNOSIS — M25512 Pain in left shoulder: Secondary | ICD-10-CM | POA: Diagnosis not present

## 2015-04-20 NOTE — Therapy (Addendum)
[place], [place], [address] Phone: [phone]   Fax:  [phone]  Physical Therapy Treatment  Patient Details  Name: Veronica Watkins MRN: [id number] Date of Birth: Jun 21, 1961 Referring Provider:  Garald Balding, MD  Encounter Date: 04/20/2015      PT End of Session - 04/20/15 1644    Visit Number 3   PT Start Time 9758   PT Stop Time 8325   PT Time Calculation (min) 54 min   Activity Tolerance Patient tolerated treatment well   Behavior During Therapy [facility] for tasks assessed/performed      History reviewed. No pertinent past medical history.  History reviewed. No pertinent past surgical history.  There were no vitals filed for this visit.  Visit Diagnosis:  Pain in joint, shoulder region, left  Muscle weakness of left upper extremity      Subjective Assessment - 04/20/15 1604    Subjective Pt reports that she returned to work yesterday. Pt reports that she is a little tired and sore.   Currently in Pain? Yes   Pain Score 2    Pain Location Shoulder   Pain Orientation Left   Pain Type Chronic pain   Pain Onset More than a month ago                         [facility] Adult PT Treatment/Exercise - 04/20/15 0001    Shoulder Exercises: ROM/Strengthening   UBE (Upper Arm Bike) Level 1 6 minutes   Shoulder Exercises: Power Warden/ranger Exercises seated row 20#, lats 20#, red tband scap stabilization 2 ways, red tband ER    Other Power [employer] Exercises ball on the wall 20 CCW & CW 20 reps 2 sets    Modalities   Modalities Cryotherapy;Electrical Stimulation   Cryotherapy   Number Minutes Cryotherapy 15 Minutes   Cryotherapy Location Shoulder   Type of Cryotherapy Ice pack   Electrical Stimulation   Electrical Stimulation Location L shoulder   Electrical Stimulation Action IFC   Electrical Stimulation Parameters tolerance    Electrical Stimulation Goals  Pain   Manual Therapy   Manual Therapy Passive ROM;Joint mobilization   Joint Mobilization Grades 2-3 all directions   Passive ROM PROM of the left shoulder all motions to end range                  PT Short Term Goals - 04/20/15 1647    PT SHORT TERM GOAL #1   Title independent HEP   Status Achieved   PT SHORT TERM GOAL #2   Title L shoulder ROM as follows:  170 deg flexion; 145 deg ABD, 60-70 deg ER,    IR to T9   Status On-going           PT Long Term Goals - 04/20/15 1647    PT LONG TERM GOAL #1   Title Pt. will have 0/10 pain   Status On-going   PT LONG TERM GOAL #2   Title Patient will be able to lift 5 lbs overhead without pain for ADL   Status On-going               Plan - 04/20/15 1645    Clinical Impression Statement Pt continues to be tight L shoulder IR/ER, Some pain with shoulder flexion and scaption at terminal ranges that improved with joint mobs. Pt able to  tolerate exercises with increased weight and intensities.   Rehab Potential Good   PT Frequency 2x / week   PT Duration 8 weeks   PT Treatment/Interventions Electrical Stimulation;Cryotherapy;Moist Heat;Ultrasound;Therapeutic activities;Therapeutic exercise;Patient/family education;Manual techniques   PT Next Visit Plan jt mobs, ER stretching, retractor/rotator strengthening        Problem List Patient Active Problem List   Diagnosis Date Noted  . Sesamoiditis 01/12/2014  . Pain in lower limb 01/12/2014  . 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  Patient goals were not met. Patient is being discharged due to not returning since the last visit.  ?????       Scot Jun, PTA  04/20/2015, 4:48 PM  [place], [place], [address] Phone: [phone]   Fax:  [phone]

## 2016-05-21 ENCOUNTER — Ambulatory Visit (INDEPENDENT_AMBULATORY_CARE_PROVIDER_SITE_OTHER): Payer: Managed Care, Other (non HMO) | Admitting: Orthopaedic Surgery

## 2016-05-21 DIAGNOSIS — M25531 Pain in right wrist: Secondary | ICD-10-CM

## 2016-05-21 DIAGNOSIS — M25512 Pain in left shoulder: Secondary | ICD-10-CM | POA: Diagnosis not present

## 2016-05-21 DIAGNOSIS — M7552 Bursitis of left shoulder: Secondary | ICD-10-CM | POA: Diagnosis not present

## 2016-05-21 DIAGNOSIS — M25511 Pain in right shoulder: Secondary | ICD-10-CM | POA: Diagnosis not present

## 2016-06-25 ENCOUNTER — Ambulatory Visit (INDEPENDENT_AMBULATORY_CARE_PROVIDER_SITE_OTHER): Payer: Managed Care, Other (non HMO) | Admitting: Orthopaedic Surgery

## 2016-07-02 ENCOUNTER — Ambulatory Visit (INDEPENDENT_AMBULATORY_CARE_PROVIDER_SITE_OTHER): Payer: Managed Care, Other (non HMO) | Admitting: Orthopaedic Surgery

## 2016-08-03 DIAGNOSIS — K219 Gastro-esophageal reflux disease without esophagitis: Secondary | ICD-10-CM | POA: Insufficient documentation

## 2016-12-21 ENCOUNTER — Encounter (INDEPENDENT_AMBULATORY_CARE_PROVIDER_SITE_OTHER): Payer: Managed Care, Other (non HMO) | Admitting: Ophthalmology

## 2016-12-24 ENCOUNTER — Encounter (INDEPENDENT_AMBULATORY_CARE_PROVIDER_SITE_OTHER): Payer: Managed Care, Other (non HMO) | Admitting: Ophthalmology

## 2016-12-24 DIAGNOSIS — H35371 Puckering of macula, right eye: Secondary | ICD-10-CM | POA: Diagnosis not present

## 2016-12-24 DIAGNOSIS — H35033 Hypertensive retinopathy, bilateral: Secondary | ICD-10-CM | POA: Diagnosis not present

## 2016-12-24 DIAGNOSIS — H2512 Age-related nuclear cataract, left eye: Secondary | ICD-10-CM

## 2016-12-24 DIAGNOSIS — I10 Essential (primary) hypertension: Secondary | ICD-10-CM

## 2016-12-24 DIAGNOSIS — H43813 Vitreous degeneration, bilateral: Secondary | ICD-10-CM

## 2016-12-24 DIAGNOSIS — H26491 Other secondary cataract, right eye: Secondary | ICD-10-CM | POA: Diagnosis not present

## 2016-12-24 DIAGNOSIS — H353111 Nonexudative age-related macular degeneration, right eye, early dry stage: Secondary | ICD-10-CM | POA: Diagnosis not present

## 2017-01-17 ENCOUNTER — Encounter (INDEPENDENT_AMBULATORY_CARE_PROVIDER_SITE_OTHER): Payer: Managed Care, Other (non HMO) | Admitting: Ophthalmology

## 2017-01-17 DIAGNOSIS — H2701 Aphakia, right eye: Secondary | ICD-10-CM

## 2017-04-25 DIAGNOSIS — E785 Hyperlipidemia, unspecified: Secondary | ICD-10-CM | POA: Insufficient documentation

## 2017-07-26 ENCOUNTER — Other Ambulatory Visit: Payer: Self-pay | Admitting: Orthopedic Surgery

## 2017-08-14 ENCOUNTER — Encounter (HOSPITAL_BASED_OUTPATIENT_CLINIC_OR_DEPARTMENT_OTHER): Payer: Self-pay | Admitting: *Deleted

## 2017-08-22 ENCOUNTER — Encounter (HOSPITAL_BASED_OUTPATIENT_CLINIC_OR_DEPARTMENT_OTHER)
Admission: RE | Disposition: A | Discharge status: Home / Self Care | Payer: Self-pay | Source: Ambulatory Visit | Attending: Orthopedic Surgery

## 2017-08-22 ENCOUNTER — Encounter (HOSPITAL_BASED_OUTPATIENT_CLINIC_OR_DEPARTMENT_OTHER): Payer: Self-pay | Admitting: *Deleted

## 2017-08-22 ENCOUNTER — Other Ambulatory Visit: Payer: Self-pay

## 2017-08-22 ENCOUNTER — Ambulatory Visit (HOSPITAL_BASED_OUTPATIENT_CLINIC_OR_DEPARTMENT_OTHER): Payer: Managed Care, Other (non HMO) | Admitting: Anesthesiology

## 2017-08-22 ENCOUNTER — Ambulatory Visit (HOSPITAL_BASED_OUTPATIENT_CLINIC_OR_DEPARTMENT_OTHER)
Admission: RE | Admit: 2017-08-22 | Discharge: 2017-08-22 | Disposition: A | Discharge status: Home / Self Care | Payer: Managed Care, Other (non HMO) | Source: Ambulatory Visit | Attending: Orthopedic Surgery | Admitting: Orthopedic Surgery

## 2017-08-22 DIAGNOSIS — M19042 Primary osteoarthritis, left hand: Secondary | ICD-10-CM | POA: Insufficient documentation

## 2017-08-22 DIAGNOSIS — E039 Hypothyroidism, unspecified: Secondary | ICD-10-CM | POA: Diagnosis not present

## 2017-08-22 DIAGNOSIS — Z79899 Other long term (current) drug therapy: Secondary | ICD-10-CM | POA: Insufficient documentation

## 2017-08-22 DIAGNOSIS — L720 Epidermal cyst: Secondary | ICD-10-CM | POA: Insufficient documentation

## 2017-08-22 DIAGNOSIS — E7800 Pure hypercholesterolemia, unspecified: Secondary | ICD-10-CM | POA: Insufficient documentation

## 2017-08-22 HISTORY — DX: Unspecified osteoarthritis, unspecified site: M19.90

## 2017-08-22 HISTORY — PX: DISTAL INTERPHALANGEAL JOINT FUSION: SHX6428

## 2017-08-22 HISTORY — DX: Pure hypercholesterolemia, unspecified: E78.00

## 2017-08-22 HISTORY — PX: MASS EXCISION: SHX2000

## 2017-08-22 HISTORY — DX: Hypothyroidism, unspecified: E03.9

## 2017-08-22 SURGERY — EXCISION MASS
Anesthesia: General | Site: Hand | Laterality: Left

## 2017-08-22 MED ORDER — LACTATED RINGERS IV SOLN
INTRAVENOUS | Status: DC
  Administered 2017-08-22: 08:00:00 via INTRAVENOUS

## 2017-08-22 MED ORDER — FENTANYL CITRATE (PF) 100 MCG/2ML IJ SOLN
INTRAMUSCULAR | Status: AC
  Filled 2017-08-22: qty 2

## 2017-08-22 MED ORDER — MEPERIDINE HCL 25 MG/ML IJ SOLN: 6.2500 mg | INTRAMUSCULAR | Status: DC | PRN

## 2017-08-22 MED ORDER — DEXAMETHASONE SODIUM PHOSPHATE 10 MG/ML IJ SOLN
INTRAMUSCULAR | Status: DC | PRN
  Administered 2017-08-22: 10 mg via INTRAVENOUS

## 2017-08-22 MED ORDER — CEFAZOLIN SODIUM-DEXTROSE 2-4 GM/100ML-% IV SOLN
2.0000 g | INTRAVENOUS | Status: AC
  Administered 2017-08-22: 2 g via INTRAVENOUS

## 2017-08-22 MED ORDER — LIDOCAINE 2% (20 MG/ML) 5 ML SYRINGE
INTRAMUSCULAR | Status: DC | PRN
  Administered 2017-08-22: 60 mg via INTRAVENOUS

## 2017-08-22 MED ORDER — OXYCODONE HCL 5 MG PO TABS
ORAL_TABLET | ORAL | Status: AC
  Filled 2017-08-22: qty 1

## 2017-08-22 MED ORDER — MIDAZOLAM HCL 2 MG/2ML IJ SOLN
1.0000 mg | INTRAMUSCULAR | Status: DC | PRN
  Administered 2017-08-22: 2 mg via INTRAVENOUS

## 2017-08-22 MED ORDER — FENTANYL CITRATE (PF) 100 MCG/2ML IJ SOLN: 25.0000 ug | INTRAMUSCULAR | Status: DC | PRN

## 2017-08-22 MED ORDER — CHLORHEXIDINE GLUCONATE 4 % EX LIQD: 60.0000 mL | Freq: Once | CUTANEOUS | Status: DC

## 2017-08-22 MED ORDER — METOCLOPRAMIDE HCL 5 MG/ML IJ SOLN: 10.0000 mg | Freq: Once | INTRAMUSCULAR | Status: DC | PRN

## 2017-08-22 MED ORDER — CEFAZOLIN SODIUM-DEXTROSE 2-4 GM/100ML-% IV SOLN
INTRAVENOUS | Status: AC
  Filled 2017-08-22: qty 100

## 2017-08-22 MED ORDER — SCOPOLAMINE 1 MG/3DAYS TD PT72: 1.0000 | MEDICATED_PATCH | Freq: Once | TRANSDERMAL | Status: DC | PRN

## 2017-08-22 MED ORDER — PROPOFOL 10 MG/ML IV BOLUS
INTRAVENOUS | Status: AC
  Filled 2017-08-22: qty 20

## 2017-08-22 MED ORDER — OXYCODONE HCL 5 MG PO TABS
5.0000 mg | ORAL_TABLET | Freq: Once | ORAL | Status: AC
Start: 2017-08-22 — End: 2017-08-22
  Administered 2017-08-22: 5 mg via ORAL

## 2017-08-22 MED ORDER — DEXAMETHASONE SODIUM PHOSPHATE 10 MG/ML IJ SOLN
INTRAMUSCULAR | Status: AC
  Filled 2017-08-22: qty 1

## 2017-08-22 MED ORDER — PROPOFOL 10 MG/ML IV BOLUS
INTRAVENOUS | Status: DC | PRN
  Administered 2017-08-22: 150 mg via INTRAVENOUS

## 2017-08-22 MED ORDER — LACTATED RINGERS IV SOLN: INTRAVENOUS | Status: DC

## 2017-08-22 MED ORDER — MIDAZOLAM HCL 2 MG/2ML IJ SOLN
INTRAMUSCULAR | Status: AC
Start: 2017-08-22 — End: 2017-08-22
  Filled 2017-08-22: qty 2

## 2017-08-22 MED ORDER — ONDANSETRON HCL 4 MG/2ML IJ SOLN
INTRAMUSCULAR | Status: AC
  Filled 2017-08-22: qty 2

## 2017-08-22 MED ORDER — FENTANYL CITRATE (PF) 100 MCG/2ML IJ SOLN
50.0000 ug | INTRAMUSCULAR | Status: DC | PRN
  Administered 2017-08-22: 100 ug via INTRAVENOUS

## 2017-08-22 MED ORDER — BUPIVACAINE HCL (PF) 0.25 % IJ SOLN
INTRAMUSCULAR | Status: DC | PRN
  Administered 2017-08-22: 10 mL

## 2017-08-22 MED ORDER — OXYCODONE-ACETAMINOPHEN 5-325 MG PO TABS: ORAL_TABLET | ORAL | 0 refills | Status: AC

## 2017-08-22 SURGICAL SUPPLY — 67 items
APL SKNCLS STERI-STRIP NONHPOA (GAUZE/BANDAGES/DRESSINGS)
BANDAGE ACE 3X5.8 VEL STRL LF (GAUZE/BANDAGES/DRESSINGS) IMPLANT
BANDAGE COBAN STERILE 2 (GAUZE/BANDAGES/DRESSINGS) IMPLANT
BENZOIN TINCTURE PRP APPL 2/3 (GAUZE/BANDAGES/DRESSINGS) IMPLANT
BLADE MINI RND TIP GREEN BEAV (BLADE) ×3 IMPLANT
BLADE SURG 15 STRL LF DISP TIS (BLADE) ×2 IMPLANT
BLADE SURG 15 STRL SS (BLADE) ×6
BNDG CMPR 9X4 STRL LF SNTH (GAUZE/BANDAGES/DRESSINGS) ×1
BNDG COHESIVE 1X5 TAN STRL LF (GAUZE/BANDAGES/DRESSINGS) ×3 IMPLANT
BNDG COHESIVE 3X5 TAN STRL LF (GAUZE/BANDAGES/DRESSINGS) ×3 IMPLANT
BNDG CONFORM 2 STRL LF (GAUZE/BANDAGES/DRESSINGS) IMPLANT
BNDG ELASTIC 2X5.8 VLCR STR LF (GAUZE/BANDAGES/DRESSINGS) IMPLANT
BNDG ESMARK 4X9 LF (GAUZE/BANDAGES/DRESSINGS) ×3 IMPLANT
BNDG GAUZE 1X2.1 STRL (MISCELLANEOUS) IMPLANT
BNDG GAUZE ELAST 4 BULKY (GAUZE/BANDAGES/DRESSINGS) ×3 IMPLANT
BNDG PLASTER X FAST 3X3 WHT LF (CAST SUPPLIES) IMPLANT
BNDG PLSTR 9X3 FST ST WHT (CAST SUPPLIES)
BUR FAST CUTTING MED (BURR) IMPLANT
CHLORAPREP W/TINT 26ML (MISCELLANEOUS) ×3 IMPLANT
CLOSURE WOUND 1/2 X4 (GAUZE/BANDAGES/DRESSINGS)
CORD BIPOLAR FORCEPS 12FT (ELECTRODE) ×3 IMPLANT
COVER BACK TABLE 60X90IN (DRAPES) ×3 IMPLANT
COVER MAYO STAND STRL (DRAPES) ×3 IMPLANT
CUFF TOURNIQUET SINGLE 18IN (TOURNIQUET CUFF) ×3 IMPLANT
DRAPE EXTREMITY T 121X128X90 (DRAPE) ×3 IMPLANT
DRAPE OEC MINIVIEW 54X84 (DRAPES) ×3 IMPLANT
DRAPE SURG 17X23 STRL (DRAPES) ×3 IMPLANT
GAUZE SPONGE 4X4 12PLY STRL (GAUZE/BANDAGES/DRESSINGS) ×3 IMPLANT
GAUZE XEROFORM 1X8 LF (GAUZE/BANDAGES/DRESSINGS) ×3 IMPLANT
GLOVE BIO SURGEON STRL SZ7.5 (GLOVE) ×3 IMPLANT
GLOVE BIOGEL PI IND STRL 8 (GLOVE) ×1 IMPLANT
GLOVE BIOGEL PI IND STRL 8.5 (GLOVE) ×1 IMPLANT
GLOVE BIOGEL PI INDICATOR 8 (GLOVE) ×2
GLOVE BIOGEL PI INDICATOR 8.5 (GLOVE) ×2
GLOVE SURG ORTHO 8.0 STRL STRW (GLOVE) ×3 IMPLANT
GOWN STRL REUS W/ TWL LRG LVL3 (GOWN DISPOSABLE) ×1 IMPLANT
GOWN STRL REUS W/TWL LRG LVL3 (GOWN DISPOSABLE) ×3
GOWN STRL REUS W/TWL XL LVL3 (GOWN DISPOSABLE) ×3 IMPLANT
NDL PRECISIONGLIDE 27X1.5 (NEEDLE) IMPLANT
NEEDLE HYPO 25X1 1.5 SAFETY (NEEDLE) ×3 IMPLANT
NEEDLE PRECISIONGLIDE 27X1.5 (NEEDLE) IMPLANT
NS IRRIG 1000ML POUR BTL (IV SOLUTION) ×3 IMPLANT
PACK BASIN DAY SURGERY FS (CUSTOM PROCEDURE TRAY) ×3 IMPLANT
PAD CAST 3X4 CTTN HI CHSV (CAST SUPPLIES) ×1 IMPLANT
PAD CAST 4YDX4 CTTN HI CHSV (CAST SUPPLIES) IMPLANT
PADDING CAST ABS 3INX4YD NS (CAST SUPPLIES)
PADDING CAST ABS 4INX4YD NS (CAST SUPPLIES) ×2
PADDING CAST ABS COTTON 3X4 (CAST SUPPLIES) IMPLANT
PADDING CAST ABS COTTON 4X4 ST (CAST SUPPLIES) ×1 IMPLANT
PADDING CAST COTTON 3X4 STRL (CAST SUPPLIES) ×3
PADDING CAST COTTON 4X4 STRL (CAST SUPPLIES)
SLEEVE SCD COMPRESS KNEE MED (MISCELLANEOUS) IMPLANT
SPLINT FINGER 3.25 911903 (SOFTGOODS) ×2 IMPLANT
SPLINT PLASTER CAST XFAST 3X15 (CAST SUPPLIES) ×10 IMPLANT
SPLINT PLASTER XTRA FASTSET 3X (CAST SUPPLIES) ×20
STOCKINETTE 4X48 STRL (DRAPES) ×3 IMPLANT
STRIP CLOSURE SKIN 1/2X4 (GAUZE/BANDAGES/DRESSINGS) IMPLANT
SUT ETHILON 3 0 PS 1 (SUTURE) IMPLANT
SUT ETHILON 4 0 PS 2 18 (SUTURE) ×3 IMPLANT
SUT ETHILON 5 0 P 3 18 (SUTURE)
SUT NYLON ETHILON 5-0 P-3 1X18 (SUTURE) IMPLANT
SUT VIC AB 4-0 P2 18 (SUTURE) IMPLANT
SUT VICRYL 4-0 PS2 18IN ABS (SUTURE) ×3 IMPLANT
SYR BULB 3OZ (MISCELLANEOUS) ×3 IMPLANT
SYR CONTROL 10ML LL (SYRINGE) ×3 IMPLANT
TOWEL OR 17X24 6PK STRL BLUE (TOWEL DISPOSABLE) ×6 IMPLANT
UNDERPAD 30X30 (UNDERPADS AND DIAPERS) ×3 IMPLANT

## 2017-08-22 NOTE — Anesthesia Postprocedure Evaluation (Signed)
Anesthesia Post Note  Patient: Veronica Watkins  Procedure(s) Performed: LEFT LONG FINGER EXCISION MASS (Left Hand) DEBRIDEMENT DISTAL INTERPHALANGEAL JOINT (Left Hand)     Patient location during evaluation: PACU Anesthesia Type: General Level of consciousness: awake and alert Pain management: pain level controlled Vital Signs Assessment: post-procedure vital signs reviewed and stable Respiratory status: spontaneous breathing, nonlabored ventilation, respiratory function stable and patient connected to nasal cannula oxygen Cardiovascular status: blood pressure returned to baseline and stable Postop Assessment: no apparent nausea or vomiting Anesthetic complications: no    Last Vitals:  Vitals:   08/22/17 1000 08/22/17 1030  BP: (!) 142/80 (!) 147/77  Pulse: 61 75  Resp: 13 16  Temp:  36.6 C  SpO2: 100% 100%    Last Pain:  Vitals:   08/22/17 1030  TempSrc:   PainSc: 2                  Phillips Groutarignan, Sophronia Varney

## 2017-08-22 NOTE — Op Note (Signed)
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  PROCEDURE:   1. Left long finger excision of mucoid cyst 2. Left long finger debridement DIP joint including prominent radial condyle of middle phalanx.  SURGEON:  Veronica Loa, MD.  ASSISTANT:  None.  ANESTHESIA:  General.  IV FLUIDS:  Per anesthesia flow sheet.  ESTIMATED BLOOD LOSS:  Minimal.  COMPLICATIONS:  None.  SPECIMENS:  Mucoid cyst to Pathology.  TOURNIQUET TIME:  Approximately 20 minutes.  DISPOSITION:  Stable to PACU.  INDICATIONS:  Veronica Watkins is a 57 year old female, who has noted a mass in the left long finger.  It is bothersome to her.  She wished to have it removed and the DIP joint debrided to try to prevent recurrence.  Risks, benefits, and alternatives of surgery were discussed including risk of blood loss; infection; damage to nerves, vessels, tendons, ligaments, bone; failure of surgery; need for additional surgery; complications with wound healing; continued pain; recurrence of cyst.  She voiced understanding of these risks and elected to proceed.  OPERATIVE COURSE:  After being identified preoperatively by myself, the patient and I agreed upon procedure and site of procedure.  Surgical site was marked.  Risks, benefits, and alternatives of surgery were reviewed and she wished to proceed.  Surgical consent had been signed. She was given IV Ancef as preoperative antibiotic prophylaxis.  She was transferred to the operating room and placed on the  operating room table in supine position with left upper extremity on arm board.  General anesthesia was induced by anesthesiologist.  Left upper extremity was prepped and draped in normal sterile orthopedic fashion.  A surgical pause was performed between surgeons, Anesthesia, and operating room staff; and all were in agreement as to the patient, procedure, and site of procedure.  Tourniquet at the proximal aspect of the extremity was inflated to 250 mmHg after exsanguination of the limb with an Esmarch bandage.  Incision was made in a hockey-stick shape at the DIP joint of the left long finger.  This was carried into subcutaneous tissues by spreading technique.  The cyst was identified and removed.  It was sent to Pathology for examination.  The DIP joint was entered underneath the extensor tendon and debrided with the rongeurs.  Prominent condyle of the middle phalanx on the radial side was taken down with the rongeurs. This included removal of small amount of bone.  There was a nodule at the ulnar side, which was also debrided with the rongeurs.  The wound was copiously irrigated with sterile saline.  It was then closed with 4- 0 nylon in a horizontal  mattress fashion.  A digital block was performed with 0.25% plain Marcaine to aid in postoperative analgesia.  The wound was dressed with sterile Xeroform, 4x4, and wrapped with a Coban dressing lightly.  An AlumaFoam splint was placed and wrapped lightly with Coban dressing.  Tourniquet was deflated approximately 20 minutes. Fingertips were pink with brisk capillary refill after deflation of tourniquet.  Operative drapes were broken down.  The patient was awoken from anesthesia safely.  She was transferred back to stretcher and taken to PACU in stable condition.  I will see her back in the office in 1 week for postoperative followup.  I will give her Percocet 5/325 one to two p.o. q.6 hours p.r.n. pain, dispensed #20.     Veronica LoaKevin  Veronica Neuhaus, MD     KK/MEDQ  D:  08/22/2017  T:  08/22/2017  Job:  [id number]

## 2017-08-22 NOTE — Op Note (Signed)
[id number] 

## 2017-08-22 NOTE — Discharge Instructions (Addendum)
Oxycodone 5mg  given at 1030 am.   Post Anesthesia Home Care Instructions  Activity: Get plenty of rest for the remainder of the day. A responsible individual must stay with you for 24 hours following the procedure.  For the next 24 hours, DO NOT: -Drive a car -Advertising copywriterperate machinery -Drink alcoholic beverages -Take any medication unless instructed by your physician -Make any legal decisions or sign important papers.  Meals: Start with liquid foods such as gelatin or soup. Progress to regular foods as tolerated. Avoid greasy, spicy, heavy foods. If nausea and/or vomiting occur, drink only clear liquids until the nausea and/or vomiting subsides. Call your physician if vomiting continues.  Special Instructions/Symptoms: Your throat may feel dry or sore from the anesthesia or the breathing tube placed in your throat during surgery. If this causes discomfort, gargle with warm salt water. The discomfort should disappear within 24 hours.  If you had a scopolamine patch placed behind your ear for the management of post- operative nausea and/or vomiting:  1. The medication in the patch is effective for 72 hours, after which it should be removed.  Wrap patch in a tissue and discard in the trash. Wash hands thoroughly with soap and water. 2. You may remove the patch earlier than 72 hours if you experience unpleasant side effects which may include dry mouth, dizziness or visual disturbances. 3. Avoid touching the patch. Wash your hands with soap and water after contact with the patch.   [facility] Instructions Hand Surgery  Wound Care: Keep your hand elevated above the level of your heart.  Do not allow it to dangle by your side.  Keep the dressing dry and do not remove it unless your doctor advises you to do so.  He will usually change it at the time of your post-op visit.  Moving your fingers is advised to stimulate circulation but will depend on the site of your surgery.  If you have a splint  applied, your doctor will advise you regarding movement.  Activity: Do not drive or operate machinery today.  Rest today and then you may return to your normal activity and work as indicated by your physician.  Diet:  Drink liquids today or eat a light diet.  You may resume a regular diet tomorrow.    General expectations: Pain for two to three days. Fingers may become slightly swollen.  Call your doctor if any of the following occur: Severe pain not relieved by pain medication. Elevated temperature. Dressing soaked with blood. Inability to move fingers. White or bluish color to fingers.

## 2017-08-22 NOTE — Anesthesia Procedure Notes (Signed)
Procedure Name: LMA Insertion Date/Time: 08/22/2017 8:56 AM Performed by: Burna Cashonrad, Griff Badley C, CRNA Pre-anesthesia Checklist: Patient identified, Emergency Drugs available, Suction available and Patient being monitored Patient Re-evaluated:Patient Re-evaluated prior to induction Oxygen Delivery Method: Circle system utilized Preoxygenation: Pre-oxygenation with 100% oxygen Induction Type: IV induction Ventilation: Mask ventilation without difficulty LMA: LMA inserted LMA Size: 4.0 Number of attempts: 1 Airway Equipment and Method: Bite block Placement Confirmation: positive ETCO2 Tube secured with: Tape Dental Injury: Teeth and Oropharynx as per pre-operative assessment

## 2017-08-22 NOTE — Transfer of Care (Signed)
Immediate Anesthesia Transfer of Care Note  Patient: Veronica Watkins  Procedure(s) Performed: LEFT LONG FINGER EXCISION MASS (Left Hand) DEBRIDEMENT DISTAL INTERPHALANGEAL JOINT (Left Hand)  Patient Location: PACU  Anesthesia Type:General  Level of Consciousness: sedated  Airway & Oxygen Therapy: Patient Spontanous Breathing and Patient connected to face mask oxygen  Post-op Assessment: Report given to RN and Post -op Vital signs reviewed and stable  Post vital signs: Reviewed and stable  Last Vitals:  Vitals:   08/22/17 0735  BP: (!) 152/84  Pulse: 64  Resp: 20  Temp: 36.8 C  SpO2: 99%    Last Pain:  Vitals:   08/22/17 0735  TempSrc: Oral         Complications: No apparent anesthesia complications

## 2017-08-22 NOTE — Anesthesia Preprocedure Evaluation (Signed)
Anesthesia Evaluation  Patient identified by MRN, date of birth, ID band Patient awake    Reviewed: Allergy & Precautions, NPO status , Patient's Chart, lab work & pertinent test results  Airway Mallampati: II  TM Distance: >3 FB Neck ROM: Full    Dental no notable dental hx.    Pulmonary neg pulmonary ROS,    Pulmonary exam normal breath sounds clear to auscultation       Cardiovascular negative cardio ROS Normal cardiovascular exam Rhythm:Regular Rate:Normal     Neuro/Psych negative neurological ROS  negative psych ROS   GI/Hepatic negative GI ROS, Neg liver ROS,   Endo/Other  negative endocrine ROS  Renal/GU negative Renal ROS  negative genitourinary   Musculoskeletal negative musculoskeletal ROS (+)   Abdominal   Peds negative pediatric ROS (+)  Hematology negative hematology ROS (+)   Anesthesia Other Findings   Reproductive/Obstetrics negative OB ROS                             Anesthesia Physical Anesthesia Plan  ASA: II  Anesthesia Plan: Bier Block and Bier Block-LIDOCAINE ONLY   Post-op Pain Management:    Induction:   PONV Risk Score and Plan: 2 and Treatment may vary due to age or medical condition  Airway Management Planned: Simple Face Mask  Additional Equipment:   Intra-op Plan:   Post-operative Plan:   Informed Consent: I have reviewed the patients History and Physical, chart, labs and discussed the procedure including the risks, benefits and alternatives for the proposed anesthesia with the patient or authorized representative who has indicated his/her understanding and acceptance.   Dental advisory given  Plan Discussed with: CRNA  Anesthesia Plan Comments:         Anesthesia Quick Evaluation

## 2017-08-22 NOTE — Brief Op Note (Signed)
08/22/2017  9:23 AM  PATIENT:  Veronica ParkinKyong C Droessler  57 y.o. female  PRE-OPERATIVE DIAGNOSIS:  LEFT LONG INGER MUCOID CYST AND DISTAL INTERPHALANGEAL JOINT ARTHRITIS  POST-OPERATIVE DIAGNOSIS:  LEFT LONG FINGER MUCOID CYST AND DISTAL INTERPHALANGEAL JOINT  PROCEDURE:  Procedure(s): LEFT LONG FINGER EXCISION MASS (Left) DEBRIDEMENT DISTAL INTERPHALANGEAL JOINT (Left)  SURGEON:  Surgeon(s) and Role:    * Betha LoaKuzma, Reiss Mowrey, MD - Primary  PHYSICIAN ASSISTANT:   ASSISTANTS: none   ANESTHESIA:   general  EBL:  Minimal   BLOOD ADMINISTERED:none  DRAINS: none   LOCAL MEDICATIONS USED:  MARCAINE     SPECIMEN:  Source of Specimen:  left long finger  DISPOSITION OF SPECIMEN:  PATHOLOGY  COUNTS:  YES  TOURNIQUET:  Left arm: ~20 minutes at 250 mmHg  DICTATION: .Other Dictation: Dictation Number [id number]  PLAN OF CARE: Discharge to home after PACU  PATIENT DISPOSITION:  PACU - hemodynamically stable.

## 2017-08-22 NOTE — H&P (Signed)
  [name] is an 57 y.o. female.   Chief Complaint: left long finger mucoid cyst HPI: 57 yo female with mucoid cyst left long finger.  She wishes to have this removed and Veronica Watkins dip joint debrided to try to prevent recurrence.  Allergies: No Known Allergies  Past Medical History:  Diagnosis Date  . Arthritis   . Elevated cholesterol   . Hypothyroidism     Past Surgical History:  Procedure Laterality Date  . ABDOMINAL HYSTERECTOMY    . ROTATOR CUFF REPAIR    . THYROID SURGERY      Family History: History reviewed. No pertinent family history.  Social History:   reports that  has never smoked. she has never used smokeless tobacco. She reports that she does not drink alcohol or use drugs.  Medications: Medications Prior to Admission  Medication Sig Dispense Refill  . atorvastatin (LIPITOR) 20 MG tablet Take 20 mg by mouth daily.    Marland Kitchen. levothyroxine (SYNTHROID, LEVOTHROID) 75 MCG tablet Take 75 mcg by mouth daily before breakfast.    . lisinopril (PRINIVIL,ZESTRIL) 5 MG tablet Take 5 mg by mouth daily.    . nabumetone (RELAFEN) 500 MG tablet TAKE 1 TABLET (500 MG TOTAL) BY MOUTH 2 (TWO) TIMES DAILY. 60 tablet 0  . Rosuvastatin Calcium (CRESTOR PO) Take by mouth.        No results found for this or any previous visit (from Veronica Watkins past 48 hour(s)).  No results found.   A comprehensive review of systems was negative.  Blood pressure (!) 152/84, pulse 64, temperature 98.2 F (36.8 C), temperature source Oral, resp. rate 20, height 5\' 2"  (1.575 m), weight 67.6 kg (149 lb), SpO2 99 %.  General appearance: alert, cooperative and appears stated age Head: Normocephalic, without obvious abnormality, atraumatic Neck: supple, symmetrical, trachea midline Resp: clear to auscultation bilaterally Cardio: regular rate and rhythm GI: non-tender Extremities: Intact sensation and capillary refill all digits.  +epl/fpl/io.  No wounds.  Pulses: 2+ and symmetric Skin: Skin color, texture,  turgor normal. No rashes or lesions Neurologic: Grossly normal Incision/Wound:none  Assessment/Plan Left long finger mucoid cyst and dip joint arthritis.  Non operative and operative treatment options were discussed with Veronica Watkins patient and patient wishes to proceed with operative treatment. Risks, benefits, and alternatives of surgery were discussed and Veronica Watkins patient agrees with Veronica Watkins plan of care.   Eria Lozoya R 08/22/2017, 8:35 AM

## 2017-08-23 ENCOUNTER — Encounter (HOSPITAL_BASED_OUTPATIENT_CLINIC_OR_DEPARTMENT_OTHER): Payer: Self-pay | Admitting: Orthopedic Surgery

## 2017-08-23 NOTE — Addendum Note (Signed)
Addendum  created 08/23/17 0902 by Lance CoonWebster, , CRNA   Charge Capture section accepted

## 2017-09-24 ENCOUNTER — Other Ambulatory Visit: Payer: Self-pay | Admitting: Orthopedic Surgery

## 2017-09-24 DIAGNOSIS — IMO0002 Reserved for concepts with insufficient information to code with codable children: Secondary | ICD-10-CM

## 2017-09-24 DIAGNOSIS — R229 Localized swelling, mass and lump, unspecified: Principal | ICD-10-CM

## 2017-11-08 DIAGNOSIS — G8929 Other chronic pain: Secondary | ICD-10-CM | POA: Insufficient documentation

## 2018-03-14 DIAGNOSIS — Z87442 Personal history of urinary calculi: Secondary | ICD-10-CM | POA: Insufficient documentation

## 2019-04-02 ENCOUNTER — Ambulatory Visit (INDEPENDENT_AMBULATORY_CARE_PROVIDER_SITE_OTHER): Payer: Managed Care, Other (non HMO)

## 2019-04-02 ENCOUNTER — Encounter: Payer: Self-pay | Admitting: Podiatry

## 2019-04-02 ENCOUNTER — Ambulatory Visit: Payer: Managed Care, Other (non HMO)

## 2019-04-02 ENCOUNTER — Ambulatory Visit: Payer: Managed Care, Other (non HMO) | Admitting: Podiatry

## 2019-04-02 ENCOUNTER — Other Ambulatory Visit: Payer: Self-pay

## 2019-04-02 VITALS — BP 127/80 | HR 72 | Temp 97.8°F

## 2019-04-02 DIAGNOSIS — M899 Disorder of bone, unspecified: Secondary | ICD-10-CM | POA: Diagnosis not present

## 2019-04-02 DIAGNOSIS — M779 Enthesopathy, unspecified: Secondary | ICD-10-CM

## 2019-04-02 DIAGNOSIS — M778 Other enthesopathies, not elsewhere classified: Secondary | ICD-10-CM

## 2019-04-02 DIAGNOSIS — M216X2 Other acquired deformities of left foot: Secondary | ICD-10-CM

## 2019-04-02 DIAGNOSIS — M216X1 Other acquired deformities of right foot: Secondary | ICD-10-CM | POA: Diagnosis not present

## 2019-04-12 NOTE — Progress Notes (Signed)
Subjective:   Patient ID: Veronica Watkins, female   DOB: 58 y.o.   MRN: [id number]   HPI 58 year old female presents the office with concerns of pain to the right big toe area.  She states that she has a history of fracture several years ago and she was told about surgery at that time to remove the piece of bone, this seems to be sesamoidectomy.  Because of the pain she has been walking outside of the foot.  She gets pain to her foot with standing.  She is to consider surgery but not least October.  She also discussed options.   Review of Systems  All other systems reviewed and are negative.  Past Medical History:  Diagnosis Date  . Arthritis   . Elevated cholesterol   . Hypothyroidism     Past Surgical History:  Procedure Laterality Date  . ABDOMINAL HYSTERECTOMY    . DISTAL INTERPHALANGEAL JOINT FUSION Left 08/22/2017   Procedure: DEBRIDEMENT DISTAL INTERPHALANGEAL JOINT;  Surgeon: Leanora Cover, MD;  Location: [facility];  Service: Orthopedics;  Laterality: Left;  Marland Kitchen MASS EXCISION Left 08/22/2017   Procedure: LEFT LONG FINGER EXCISION MASS;  Surgeon: Leanora Cover, MD;  Location: [facility];  Service: Orthopedics;  Laterality: Left;  . ROTATOR CUFF REPAIR    . THYROID SURGERY       Current Outpatient Medications:  .  azelastine (OPTIVAR) 0.05 % ophthalmic solution, Administer 1 drop to both eyes Two (2) times a day., Disp: , Rfl:  .  cycloSPORINE (RESTASIS) 0.05 % ophthalmic emulsion, Place 1 drop into both eyes daily., Disp: , Rfl:  .  fluticasone (FLONASE) 50 MCG/ACT nasal spray, 1 spray by Each Nare route daily., Disp: , Rfl:  .  oxybutynin (DITROPAN-XL) 10 MG 24 hr tablet, Take by mouth., Disp: , Rfl:  .  pravastatin (PRAVACHOL) 40 MG tablet, TAKE ONE TABLET BY MOUTH DAILY, Disp: , Rfl:  .  triamcinolone (NASACORT) 55 MCG/ACT AERO nasal inhaler, Place into the nose., Disp: , Rfl:  .  triamcinolone cream (KENALOG) 0.1 %, Apply topically., Disp: , Rfl:  .   zolpidem (AMBIEN CR) 12.5 MG CR tablet, Take by mouth., Disp: , Rfl:  .  atorvastatin (LIPITOR) 20 MG tablet, Take 20 mg by mouth daily., Disp: , Rfl:  .  levothyroxine (SYNTHROID, LEVOTHROID) 75 MCG tablet, Take 75 mcg by mouth daily before breakfast., Disp: , Rfl:  .  lisinopril (PRINIVIL,ZESTRIL) 5 MG tablet, Take 5 mg by mouth daily., Disp: , Rfl:  .  meloxicam (MOBIC) 15 MG tablet, , Disp: , Rfl:  .  nabumetone (RELAFEN) 500 MG tablet, TAKE 1 TABLET (500 MG TOTAL) BY MOUTH 2 (TWO) TIMES DAILY., Disp: 60 tablet, Rfl: 0 .  omeprazole (PRILOSEC) 20 MG capsule, , Disp: , Rfl:  .  oxyCODONE-acetaminophen (PERCOCET) 5-325 MG tablet, 1-2 tabs po q6 hours prn pain, Disp: 20 tablet, Rfl: 0 .  Rosuvastatin Calcium (CRESTOR PO), Take by mouth.  , Disp: , Rfl:   No Known Allergies       Objective:  Physical Exam  General: AAO x3, NAD  Dermatological: Skin is warm, dry and supple bilateral. Nails x 10 are well manicured; remaining integument appears unremarkable at this time. There are no open sores, no preulcerative lesions, no rash or signs of infection present.  Vascular: Dorsalis Pedis artery and Posterior Tibial artery pedal pulses are 2/4 bilateral with immedate capillary fill time. There is no pain with calf compression, swelling, warmth,  erythema.   Neruologic: Grossly intact via light touch bilateral. Vibratory intact via tuning fork bilateral. Protective threshold with Semmes Wienstein monofilament intact to all pedal sites bilateral.   Musculoskeletal: Tenderness palpation generally on the medial sesamoid.  There is no edema, erythema.  No PIPJ range of motion.  No other areas of pinpoint tenderness.  No edema, erythema identified today.  Muscular strength 5/5 in all groups tested bilateral.  Gait: Unassisted, Nonantalgic.       Assessment:   Sesamoiditis right foot    Plan:  -Treatment options discussed including all alternatives, risks, and complications -Etiology of  symptoms were discussed -X-rays were obtained and reviewed with the patient.  Skin marker areas also identify the area of pain.  No evidence of acute fracture.  Bipartite sesamoid versus old fractures as well. -We discussed both conservative as well as surgical options.  We discussed sesamoidectomy discussed risks of doing this.  In the meantime we discussed orthotics and specifically help offload the sesamoids.  She wants to proceed with this at this time.  We will have her follow-up to get measured for inserts.  We will try this over the next couple months and if symptoms continue we will plan for sesamoidectomy.  She is in agreement with this.  Vivi BarrackMatthew R Jema Deegan DPM

## 2019-04-23 ENCOUNTER — Ambulatory Visit: Payer: Managed Care, Other (non HMO) | Admitting: Orthotics

## 2019-04-23 ENCOUNTER — Other Ambulatory Visit: Payer: Self-pay

## 2019-04-23 DIAGNOSIS — M899 Disorder of bone, unspecified: Secondary | ICD-10-CM

## 2019-04-23 DIAGNOSIS — M778 Other enthesopathies, not elsewhere classified: Secondary | ICD-10-CM

## 2019-04-23 NOTE — Progress Notes (Signed)
Patient came in today to pick up custom made foot orthotics.  The goals were accomplished and the patient reported no dissatisfaction with said orthotics.  Patient was advised of breakin period and how to report any issues. 

## 2019-05-08 ENCOUNTER — Other Ambulatory Visit: Payer: Self-pay | Admitting: Family Medicine

## 2019-05-08 DIAGNOSIS — Z1231 Encounter for screening mammogram for malignant neoplasm of breast: Secondary | ICD-10-CM

## 2019-06-25 ENCOUNTER — Ambulatory Visit: Payer: Managed Care, Other (non HMO)
